# Patient Record
Sex: Male | Born: 1980 | Race: Black or African American | Hispanic: No | Marital: Married | Smoking: Current every day smoker
Health system: Southern US, Community
[De-identification: ages and names within clinical notes are randomized; demographics above are authoritative.]

---

## 2020-04-09 ENCOUNTER — Emergency Department (HOSPITAL_COMMUNITY): Payer: Self-pay

## 2020-04-09 ENCOUNTER — Other Ambulatory Visit: Payer: Self-pay

## 2020-04-09 ENCOUNTER — Encounter (HOSPITAL_COMMUNITY): Payer: Self-pay | Admitting: Emergency Medicine

## 2020-04-09 ENCOUNTER — Emergency Department (HOSPITAL_COMMUNITY)
Admission: EM | Admit: 2020-04-09 | Discharge: 2020-04-10 | Disposition: A | Discharge status: Home / Self Care | Payer: Self-pay | Attending: Emergency Medicine | Admitting: Emergency Medicine

## 2020-04-09 DIAGNOSIS — F17210 Nicotine dependence, cigarettes, uncomplicated: Secondary | ICD-10-CM | POA: Insufficient documentation

## 2020-04-09 DIAGNOSIS — B353 Tinea pedis: Secondary | ICD-10-CM | POA: Insufficient documentation

## 2020-04-09 MED ORDER — CLOTRIMAZOLE-BETAMETHASONE 1-0.05 % EX CREA: TOPICAL_CREAM | CUTANEOUS | 2 refills | Status: AC

## 2020-04-09 MED ORDER — DOXYCYCLINE HYCLATE 100 MG PO CAPS: 100.0000 mg | ORAL_CAPSULE | Freq: Two times a day (BID) | ORAL | 0 refills | Status: AC

## 2020-04-09 NOTE — ED Provider Notes (Signed)
[facility] EMERGENCY DEPARTMENT Provider Note   CSN: [id number] Arrival date & time: 04/09/20  2055     History Chief Complaint  Patient presents with  . Foot Pain    Ryan Walter is a 39 y.o. male.  Patient presents to the emergency department for evaluation of foot pain.  Patient reports that he has had pain for 1 week.  He denies any injury.  Pain is centered around the space between his fourth and fifth toes on the left foot.        History reviewed. No pertinent past medical history.  There are no problems to display for this patient.   History reviewed. No pertinent surgical history.     No family history on file.  Social History   Tobacco Use  . Smoking status: Current Every Day Smoker    Types: Cigarettes  . Smokeless tobacco: Never Used  Substance Use Topics  . Alcohol use: Never  . Drug use: Never    Home Medications Prior to Admission medications   Medication Sig Start Date End Date Taking? Authorizing Provider  clotrimazole-betamethasone (LOTRISONE) cream Apply to affected area 2 times daily prn 04/09/20   Era Parr, Canary Brim, MD  doxycycline (VIBRAMYCIN) 100 MG capsule Take 1 capsule (100 mg total) by mouth 2 (two) times daily. 04/09/20   Gilda Crease, MD    Allergies    Patient has no known allergies.  Review of Systems   Review of Systems  Musculoskeletal:       Foot pain  All other systems reviewed and are negative.   Physical Exam Updated Vital Signs BP 129/74 (BP Location: Right Arm)   Pulse 87   Temp 99 F (37.2 C) (Oral)   Resp 17   Ht 5\' 11"  (1.803 m)   Wt 102.1 kg   SpO2 97%   BMI 31.38 kg/m   Physical Exam Vitals and nursing note reviewed.  Constitutional:      Appearance: Normal appearance.  HENT:     Head: Normocephalic.  Musculoskeletal:     Left foot: Normal range of motion. Tenderness (Webspace between fourth and fifth toes) present. No swelling, deformity or laceration.  Skin:    Comments:  Cracking and maceration with whitening of the superficial layers of skin between fourth and fifth toe on left foot, no drainage  Neurological:     Mental Status: He is alert.     ED Results / Procedures / Treatments   Labs (all labs ordered are listed, but only abnormal results are displayed) Labs Reviewed - No data to display  EKG None  Radiology DG Foot Complete Left  Result Date: 04/09/2020 CLINICAL DATA:  Initial evaluation for acute foot pain. No known injury. EXAM: LEFT FOOT - COMPLETE 3+ VIEW COMPARISON:  None. FINDINGS: No acute fracture or dislocation. Joint spaces fairly well-maintained without evidence for degenerative or erosive arthropathy. Mild degenerative spurring about the dorsal talonavicular joint. Plantar calcaneal enthesophyte. Osseous mineralization normal. No soft tissue abnormality. IMPRESSION: 1. No acute osseous abnormality. 2. Degenerative spurring at the dorsal talonavicular joint. 3. Plantar calcaneal enthesophyte. Electronically Signed   By: 04/11/2020 M.D.   On: 04/09/2020 22:22    Procedures Procedures (including critical care time)  Medications Ordered in ED Medications - No data to display  ED Course  I have reviewed the triage vital signs and the nursing notes.  Pertinent labs & imaging results that were available during my care of the patient were reviewed by me and  considered in my medical decision making (see chart for details).    MDM Rules/Calculators/A&P                      Examination consistent with early athlete's foot.  Patient reports that he wears heavy work boots all day at work.  He does have some tenderness over the dorsal aspect of the foot adjacent to the affected area, no sign of abscess formation.  No induration or redness.  Empirically cover with antibiotics as well as antifungals.  Final Clinical Impression(s) / ED Diagnoses Final diagnoses:  Athlete's foot on left    Rx / DC Orders ED Discharge Orders          Ordered    clotrimazole-betamethasone (LOTRISONE) cream     04/09/20 2355    doxycycline (VIBRAMYCIN) 100 MG capsule  2 times daily     04/09/20 2355           Orpah Greek, MD 04/09/20 2355

## 2020-04-09 NOTE — ED Triage Notes (Signed)
Pt c/o left foot pain since last week, denies injury

## 2021-02-07 IMAGING — DX DG FOOT COMPLETE 3+V*L*
3 series · 3 of 3 positions shown · non-contrast
Comparison: None.

CLINICAL DATA: Initial evaluation for acute foot pain. No known
injury.

EXAM:
LEFT FOOT - COMPLETE 3+ VIEW

[foot ap]
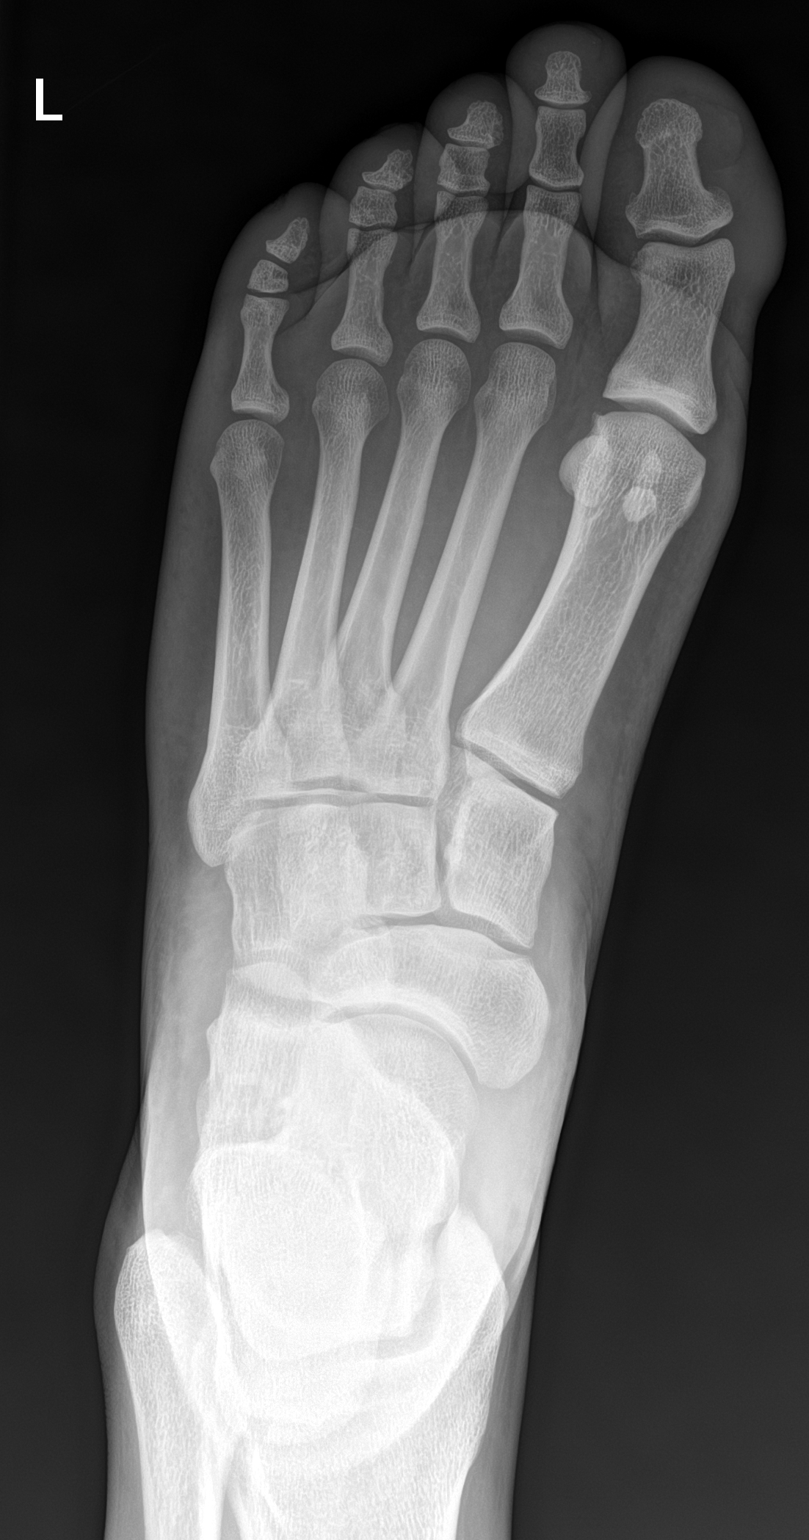

[foot obl]
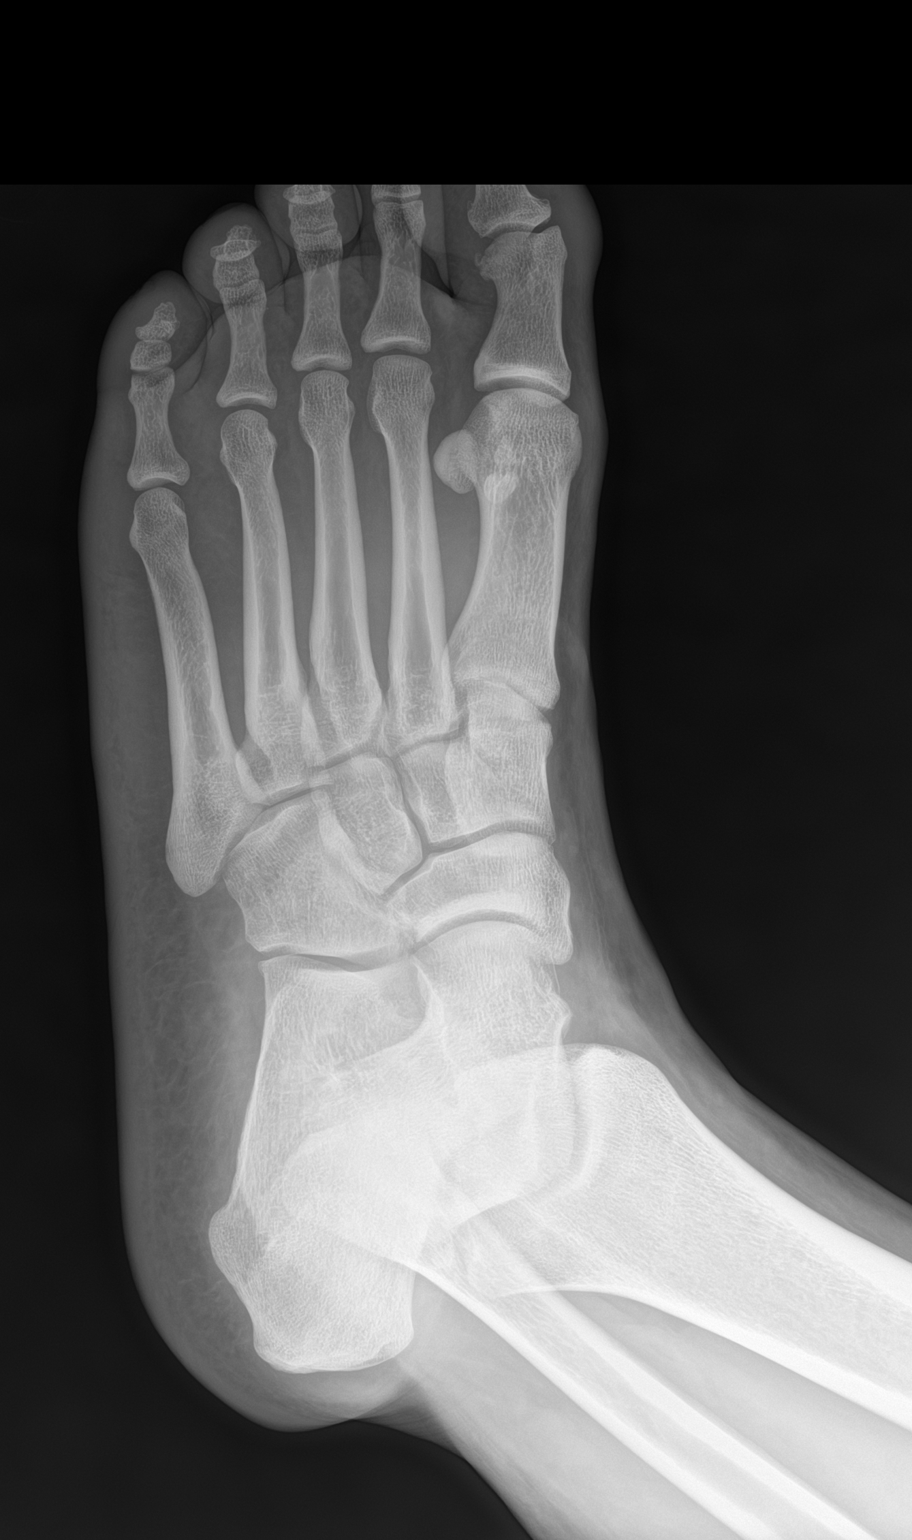

[foot lat]
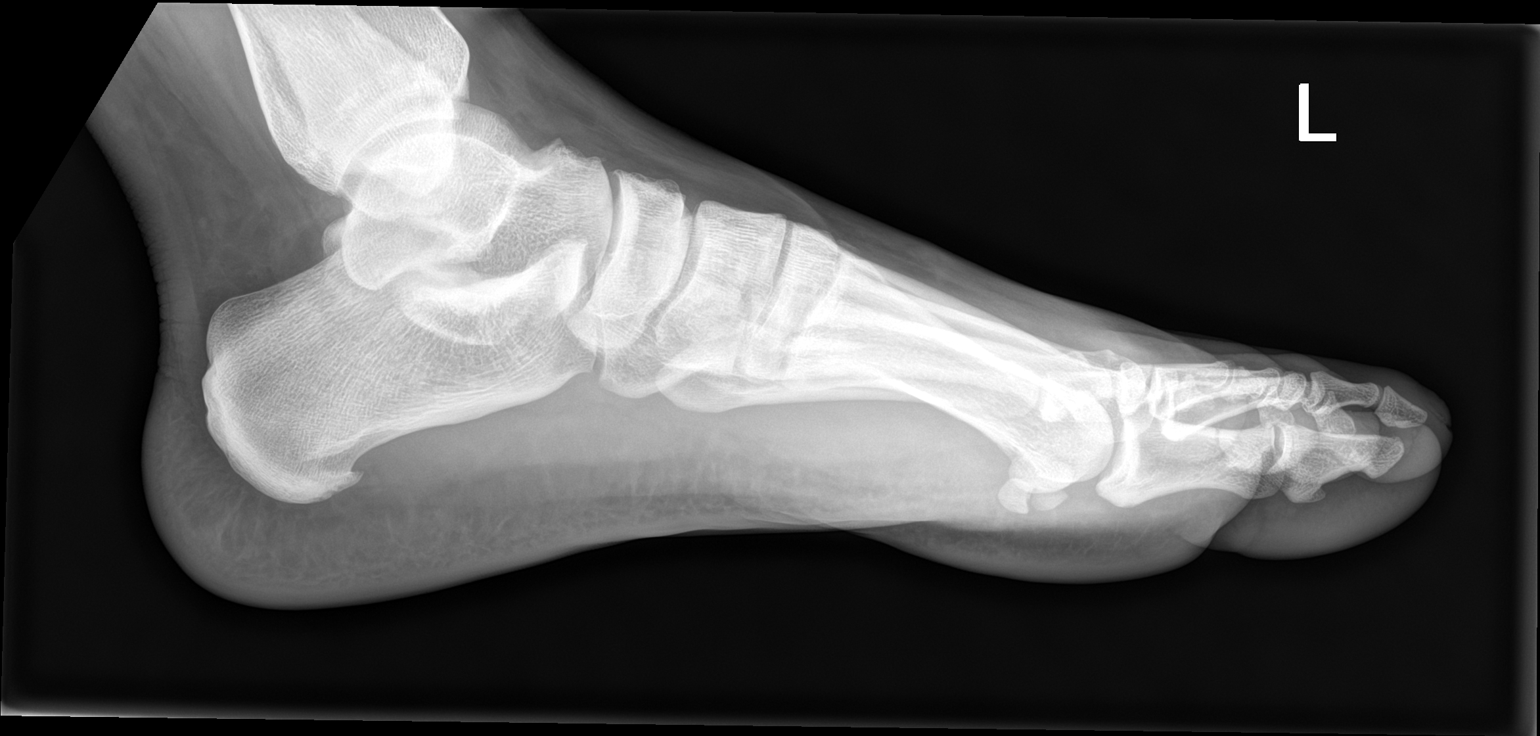

[3 of 3 positions shown; findings below may reference images not displayed]

FINDINGS: No acute fracture or dislocation. Joint spaces fairly
well-maintained without evidence for degenerative or erosive
arthropathy. Mild degenerative spurring about the dorsal
talonavicular joint. Plantar calcaneal enthesophyte. Osseous
mineralization normal. No soft tissue abnormality.
IMPRESSION: 1. No acute osseous abnormality.
2. Degenerative spurring at the dorsal talonavicular joint.
3. Plantar calcaneal enthesophyte.
